# Patient Record
Sex: Male | Born: 1960 | Hispanic: Yes | Marital: Married | Smoking: Current every day smoker
Health system: Southern US, Community
[De-identification: ages and names within clinical notes are randomized; demographics above are authoritative.]

## PROBLEM LIST (undated history)

## (undated) DIAGNOSIS — I999 Unspecified disorder of circulatory system: Secondary | ICD-10-CM

## (undated) HISTORY — PX: FOOT SURGERY: SHX648

---

## 2013-12-08 DIAGNOSIS — I999 Unspecified disorder of circulatory system: Secondary | ICD-10-CM

## 2013-12-08 HISTORY — DX: Unspecified disorder of circulatory system: I99.9

## 2015-02-13 ENCOUNTER — Emergency Department
Admission: EM | Admit: 2015-02-13 | Discharge: 2015-02-14 | Disposition: A | Discharge status: Home / Self Care | Payer: Self-pay | Attending: Emergency Medicine | Admitting: Emergency Medicine

## 2015-02-13 ENCOUNTER — Encounter: Payer: Self-pay | Admitting: Emergency Medicine

## 2015-02-13 ENCOUNTER — Emergency Department: Payer: Self-pay

## 2015-02-13 DIAGNOSIS — M79674 Pain in right toe(s): Secondary | ICD-10-CM | POA: Insufficient documentation

## 2015-02-13 DIAGNOSIS — G8918 Other acute postprocedural pain: Secondary | ICD-10-CM | POA: Insufficient documentation

## 2015-02-13 DIAGNOSIS — Z720 Tobacco use: Secondary | ICD-10-CM | POA: Insufficient documentation

## 2015-02-13 HISTORY — DX: Unspecified disorder of circulatory system: I99.9

## 2015-02-13 NOTE — ED Notes (Signed)
Pt. States having 4th toe on rt. Foot ambulated in [place] on June 1st this year due to poor circulation Pt. states having pain to rt. foot for past week.  Pt. has swelling to rt. foot.  Pt. has redness, swelling and weeping from 2nd toe of rt. Foot.   Pt. Has not been able to refill blood circulation drug(pentoxifilina ) for the past week.

## 2015-02-13 NOTE — ED Provider Notes (Signed)
[facility] Emergency Department Provider Note    ____________________________________________  Time seen: 2310  I have reviewed the triage vital signs and the nursing notes.   HISTORY  Chief Complaint Post-op Problem    HPI Brian Chandler is a 54 y.o. male who comes into the emergency department because of right second toe pain. He states that he started having pain yesterday. He also noticed swelling of that right second toe. He states that he had a surgery in [place] a couple of months ago for his fourth toe on that foot. He states he was put on a medication that helps his blood circulation. He states he ran out of this last week. He states the pain is worse when he tries walking. He denies any fevers, vomiting, nausea or diarrhea.   Past Medical History  Diagnosis Date  . Circulation problem 12/2013    There are no active problems to display for this patient.   Past Surgical History  Procedure Laterality Date  . Foot surgery  june 1st     surgery was in [place]    No current outpatient prescriptions on file.  Allergies Review of patient's allergies indicates no known allergies.  History reviewed. No pertinent family history.  Social History History  Substance Use Topics  . Smoking status: Current Every Day Smoker -- 2.00 packs/day for 25 years    Types: Cigarettes  . Smokeless tobacco: Not on file  . Alcohol Use: 14.4 oz/week    24 Cans of beer per week    Review of Systems  Constitutional: Negative for fever. Cardiovascular: Negative for chest pain. Respiratory: Negative for shortness of breath. Gastrointestinal: Negative for abdominal pain, vomiting and diarrhea. Genitourinary: Negative for dysuria. Musculoskeletal: Negative for back pain. Skin: Negative for rash. Neurological: Negative for headaches, focal weakness or numbness.  10-point ROS otherwise negative.  ____________________________________________   PHYSICAL  EXAM:  VITAL SIGNS: ED Triage Vitals  Enc Vitals Group     BP 02/13/15 2014 170/86 mmHg     Pulse Rate 02/13/15 2014 73     Resp 02/13/15 2014 16     Temp 02/13/15 2014 98.5 F (36.9 C)     Temp Source 02/13/15 2014 Oral     SpO2 02/13/15 2014 98 %     Weight 02/13/15 2014 185 lb (83.915 kg)     Height 02/13/15 2014  (1.778 m)     Head Cir --      Peak Flow --      Pain Score 02/13/15 2015 8   Constitutional: Alert and oriented. Well appearing and in no distress. Eyes: Conjunctivae are normal. PERRL. Normal extraocular movements. ENT   Head: Normocephalic and atraumatic.   Nose: No congestion/rhinnorhea.   Mouth/Throat: Mucous membranes are moist.   Neck: No stridor. Hematological/Lymphatic/Immunilogical: No cervical lymphadenopathy. Cardiovascular: Normal rate, regular rhythm.  Respiratory: Normal respiratory effort without tachypnea nor retractions.  Genitourinary: Deferred Musculoskeletal: Status post amputation of the right fourth toe. Second toe on the right foot with a wound at the tip. Some small drainage. Also some swelling and discoloration of the toe. Neurologic:  Normal speech and language. No gross focal neurologic deficits are appreciated. Speech is normal.  Skin:  Skin is warm, dry and intact. No rash noted. Psychiatric: Mood and affect are normal. Speech and behavior are normal. Patient exhibits appropriate insight and judgment.  ____________________________________________    LABS (pertinent positives/negatives)  None  ____________________________________________   EKG  None  ____________________________________________  RADIOLOGY  Right foot IMPRESSION: Amputation at the proximal phalanx of the fourth toe with question small residual calcification versus calcific debris or superimposed artifact at the amputation bed.  Advanced arthropathic changes of the first MTP joint suggesting an inflammatory arthropathy or prior  infection.  Osseous demineralization without acute focal bony abnormality.  ____________________________________________   PROCEDURES  Procedure(s) performed: None  Critical Care performed: No  ____________________________________________   INITIAL IMPRESSION / ASSESSMENT AND PLAN / ED COURSE  Pertinent labs & imaging results that were available during my care of the patient were reviewed by me and considered in my medical decision making (see chart for details).  Patient presents to the emergency department with concerns for right second toe pain and swelling. On exam patient does have a wound at the base. There is some discoloration and swelling to the toe. We will place patient on antibiotics. Will have patient follow-up with podiatry.  ____________________________________________   FINAL CLINICAL IMPRESSION(S) / ED DIAGNOSES  Final diagnoses:  Toe pain, right     Phineas Semen, MD 02/14/15 [phone]

## 2015-02-14 MED ORDER — CEPHALEXIN 500 MG PO CAPS
500.0000 mg | ORAL_CAPSULE | Freq: Four times a day (QID) | ORAL | Status: AC
Start: 2015-02-14 — End: 2015-02-24

## 2015-02-14 MED ORDER — OXYCODONE-ACETAMINOPHEN 5-325 MG PO TABS: 1.0000 | ORAL_TABLET | ORAL | Status: AC | PRN

## 2015-02-14 MED ORDER — PENTOXIFYLLINE ER 400 MG PO TBCR: 400.0000 mg | EXTENDED_RELEASE_TABLET | Freq: Three times a day (TID) | ORAL | Status: AC

## 2015-02-14 NOTE — Discharge Instructions (Signed)
Please call Dr. Ether Griffins with podiatry to set up an appointment for next week. Please seek medical attention for any high fevers, chest pain, shortness of breath, change in behavior, persistent vomiting, bloody stool or any other new or concerning symptoms.   Wound Infection A wound infection happens when a type of germ (bacteria) starts growing in the wound. In some cases, this can cause the wound to break open. If cared for properly, the infected wound will heal from the inside to the outside. Wound infections need treatment. CAUSES An infection is caused by bacteria growing in the wound.  SYMPTOMS   Increase in redness, swelling, or pain at the wound site.  Increase in drainage at the wound site.  Wound or bandage (dressing) starts to smell bad.  Fever.  Feeling tired or fatigued.  Pus draining from the wound. TREATMENT  Your health care provider will prescribe antibiotic medicine. The wound infection should improve within 24 to 48 hours. Any redness around the wound should stop spreading and the wound should be less painful.  HOME CARE INSTRUCTIONS   Only take over-the-counter or prescription medicines for pain, discomfort, or fever as directed by your health care provider.  Take your antibiotics as directed. Finish them even if you start to feel better.  Gently wash the area with mild soap and water 2 times a day, or as directed. Rinse off the soap. Pat the area dry with a clean towel. Do not rub the wound. This may cause bleeding.  Follow your health care provider's instructions for how often you need to change the dressing.  Apply ointment and a dressing to the wound as directed.  If the dressing sticks, moisten it with soapy water and gently remove it.  Change the bandage right away if it becomes wet, dirty, or develops a bad smell.  Take showers. Do not take tub baths, swim, or do anything that may soak the wound until it is healed.  Avoid exercises that make you sweat  heavily.  Use anti-itch medicine as directed by your health care provider. The wound may itch when it is healing. Do not pick or scratch at the wound.  Follow up with your health care provider to get your wound rechecked as directed. SEEK MEDICAL CARE IF:  You have an increase in swelling, pain, or redness around the wound.  You have an increase in the amount of pus coming from the wound.  There is a bad smell coming from the wound.  More of the wound breaks open.  You have a fever. MAKE SURE YOU:   Understand these instructions.  Will watch your condition.  Will get help right away if you are not doing well or get worse. Document Released: 03/25/2003 Document Revised: 07/01/2013 Document Reviewed: 10/30/2010 [facility] Patient Information 2015 [place], [place]. This information is not intended to replace advice given to you by your health care provider. Make sure you discuss any questions you have with your health care provider.

## 2017-04-05 IMAGING — CR DG FOOT COMPLETE 3+V*R*
1 series · 3 of 3 positions shown · non-contrast
Comparison: None

CLINICAL DATA: Fourth toe amputation on 12/09/2014 in [REDACTED], RIGHT
foot pain for past week, swelling, redness, question osteomyelitis

EXAM:
RIGHT FOOT COMPLETE - 3+ VIEW

[Series 1: dg foot complete right · 0.14mm/px · 3 of 3 slices shown]
[im 1/3]
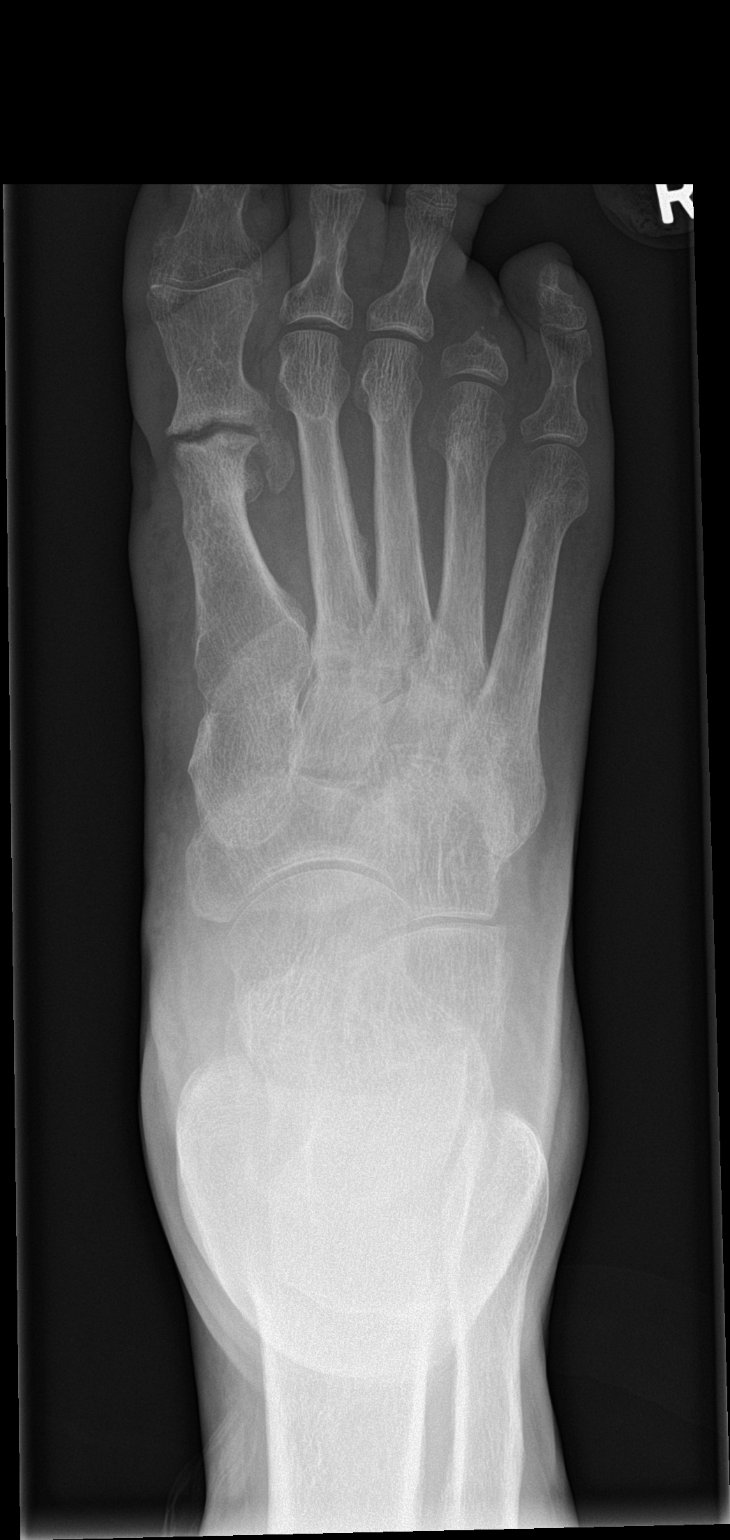
[im 2/3]
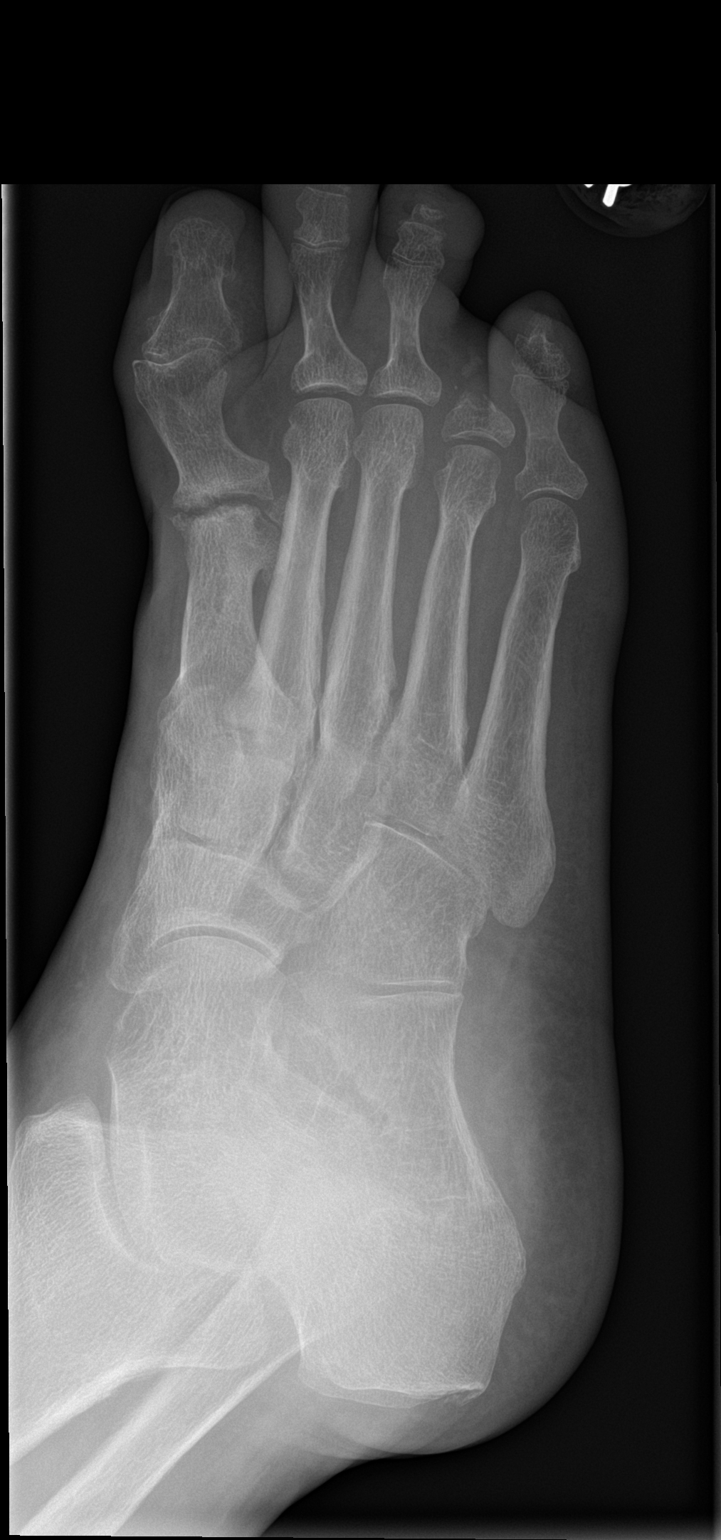
[im 3/3]
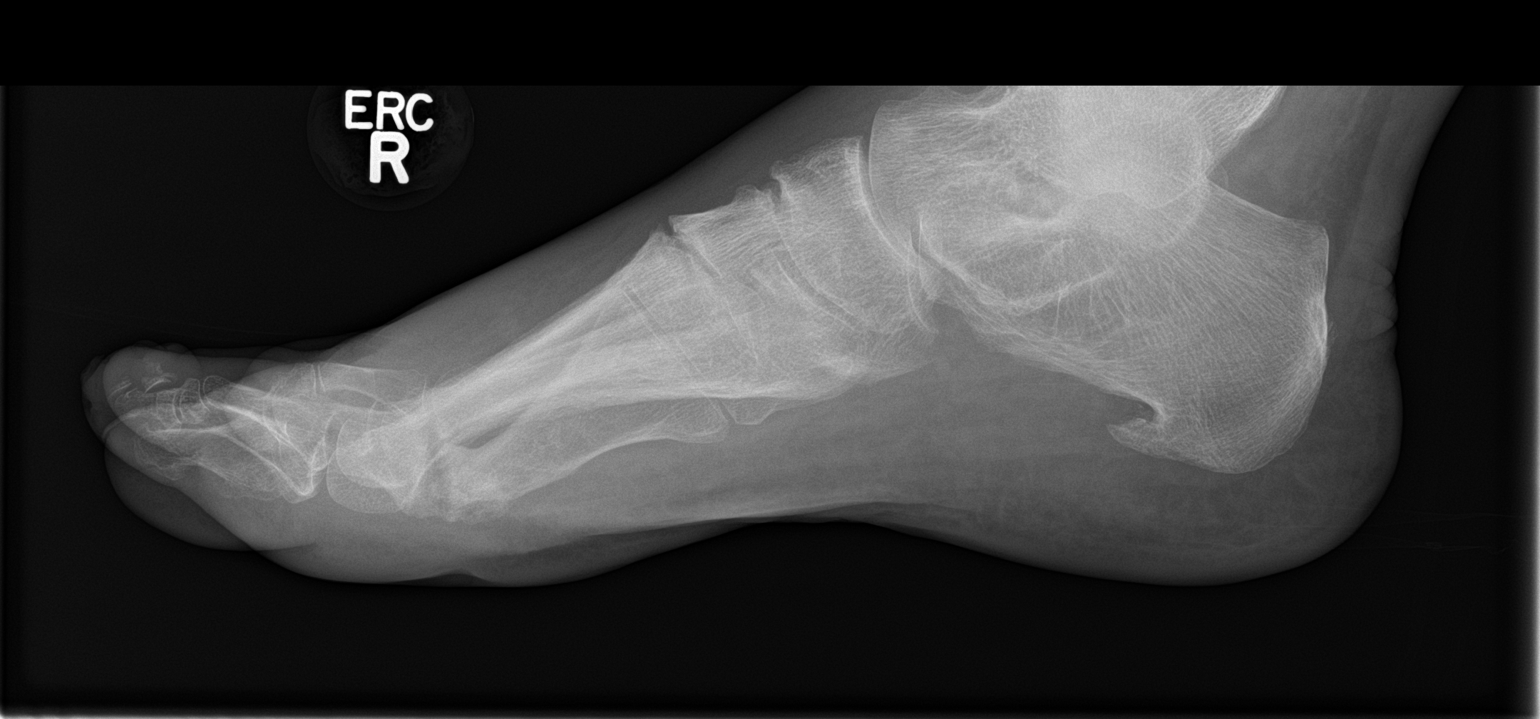

[3 of 3 positions shown; findings below may reference images not displayed]

FINDINGS: Diffuse osseous demineralization.

Amputation of fourth toe through the midportion of the proximal
phalanx.

No acute fracture or dislocation.

An area of of slightly increased attenuation is seen in the
amputation bed which could represent bone fragment or debris, or
superimposed artifact.

No definite bone destruction seen to suggest osteomyelitis at the
surgical bed.

Marked degenerative changes of the first MTP joint with joint space
obliteration, erosions of the head of the first metatarsal and base
of the proximal phalanx suggesting an inflammatory arthropathy.

Large plantar calcaneal spur.

No acute fracture or dislocation.
IMPRESSION: Amputation at the proximal phalanx of the fourth toe with question
small residual calcification versus calcific debris or superimposed
artifact at the amputation bed.

Advanced arthropathic changes of the first MTP joint suggesting an
inflammatory arthropathy or prior infection.

Osseous demineralization without acute focal bony abnormality.
